# Patient Record
Sex: Male | Born: 1950 | Race: White | Hispanic: No | Marital: Married | Smoking: Current some day smoker
Health system: Southern US, Community
[De-identification: ages and names within clinical notes are randomized; demographics above are authoritative.]

## PROBLEM LIST (undated history)

## (undated) DIAGNOSIS — M109 Gout, unspecified: Secondary | ICD-10-CM

## (undated) DIAGNOSIS — I10 Essential (primary) hypertension: Secondary | ICD-10-CM

## (undated) DIAGNOSIS — C61 Malignant neoplasm of prostate: Secondary | ICD-10-CM

## (undated) DIAGNOSIS — E119 Type 2 diabetes mellitus without complications: Secondary | ICD-10-CM

## (undated) HISTORY — PX: PROSTATECTOMY: SHX69

---

## 2004-05-17 ENCOUNTER — Emergency Department: Payer: Self-pay | Admitting: Emergency Medicine

## 2006-12-01 ENCOUNTER — Ambulatory Visit: Payer: Self-pay | Admitting: Gastroenterology

## 2007-05-19 DIAGNOSIS — A6920 Lyme disease, unspecified: Secondary | ICD-10-CM | POA: Insufficient documentation

## 2007-05-19 DIAGNOSIS — I10 Essential (primary) hypertension: Secondary | ICD-10-CM | POA: Insufficient documentation

## 2007-05-19 DIAGNOSIS — E119 Type 2 diabetes mellitus without complications: Secondary | ICD-10-CM

## 2007-05-19 DIAGNOSIS — K5909 Other constipation: Secondary | ICD-10-CM

## 2007-05-19 DIAGNOSIS — M109 Gout, unspecified: Secondary | ICD-10-CM | POA: Insufficient documentation

## 2008-02-16 ENCOUNTER — Inpatient Hospital Stay: Payer: Self-pay | Admitting: Internal Medicine

## 2010-07-21 NOTE — Assessment & Plan Note (Signed)
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 Hyacinth Meeker for  evaluation of constipation and need for colonoscopy.   The patient has been in fairly good health all of his life until the  last several years.  He has been diabetic for three years and had  hypertension during that time, but also has recently been treated.  He  has had no complications from his diabetes or hypertension such as  infarctions, angina, or strokes.  When he diagnosed with diabetes, he  voluntarily lost 50 pounds of weight.   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  He does take a fiber supplement daily  and tries to drink fluids, although, this is difficult with his job.  He  denies gas, bloating, abdominal pain, melena, or hematochezia.  He  relates that he recently did hemoccult cards as part of his physical  examination which were normal.  He has not had previous barium studies  or endoscopic exams of his gut.  He denies any upper GI or hepatobiliary  complaints.  His weight is currently stable, his appetite is good.   PAST MEDICAL HISTORY:  Remarkable for diabetes and hypertension as  mentioned above.  He also has gouty arthritis and suffers from Lyme  disease.  For this he takes Septra DS and Metronidazole three days of  each month.  He has had no major surgical procedures.   MEDICATIONS:  1. Metformin 500 mg twice a day.  2. Allopurinol 300 mg a day.  3. Colchicine 0.6 mg twice a day.  4. Lisinopril 20 mg a day.  5. Dext-Amp 20 mg a day.  6. Minocycline 100 mg a day.  7. Psyllium husks daily, and a variety of multivitamins  along with a      baby aspirin 81 mg a day.  8. Ibuprofen 800 mg a day.   FAMILY HISTORY:  Remarkable for prostate cancer in his father.  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  He does not smoke except for an occasional  cigar.   REVIEW OF SYSTEMS:  Otherwise noncontributory except for mild chronic  fatigue which he associates with his Lyme disease.   LABORATORY DATA:  Recent CBC and metabolic profile and thyroid function  tests were normal.  Hemoglobin A1C on September 4, was 6.3%.   PHYSICAL EXAMINATION:  GENERAL:  He is a healthy appearing white male in  no distress appearing his stated age.  VITAL SIGNS:  He is 6 feet 2 inches tall and weighs 218 pounds.  Blood  pressure 136/84, pulse 84 and regular.  I could not appreciate stigmata  of chronic liver disease or thyromegaly.  CHEST:  Clear and he seemed to  be in a regular rhythm without murmurs,  rubs, or gallops.  ABDOMEN:  There was no hepatosplenomegaly, abdominal masses, or  tenderness.  Bowel sounds were normal.  EXTREMITIES:  Unremarkable.  NEUROLOGY:  Mental status was clear.   ASSESSMENT:  1. Chronic functional constipation probably related to a lack of      adequate fluids in his diet.  The patient does need screening      colonoscopy because of his age.  2. History of well controlled essential hypertension.  3. Adult onset diabetes mellitus.  4. History of gouty arthritis.  5. History of chronic anxiety syndrome and he apparently is on Adderal      for this affliction.  6. Family history of prostate cancer.  7. Vague history of Lyme disease.  The patient does take chronic      antibiotics as mentioned above.   RECOMMENDATIONS:  1. High fiber diet with fiber supplements and liberal p.o. fluids.  2. Trial of MiraLax 8 ounces at bedtime on a regular basis.  3. Outpatient colonoscopy examination.  4. Adjustments in medications for colonoscopy.  5. Consider  Amitiza trial.  6. Continue other medications as per Dr. Rondel Baton excellent medical      care.     [name]. Jarold Motto, MD, Caleen Essex, FAGA  Electronically Signed    DRP/MedQ  DD: 12/01/2006  DT: 12/01/2006  Job #: [id number]   cc:   Bethann Punches

## 2011-09-06 ENCOUNTER — Ambulatory Visit: Payer: Self-pay | Admitting: Unknown Physician Specialty

## 2016-04-09 ENCOUNTER — Emergency Department
Admission: EM | Admit: 2016-04-09 | Discharge: 2016-04-09 | Disposition: A | Discharge status: Home / Self Care | Payer: Medicare HMO | Attending: Emergency Medicine | Admitting: Emergency Medicine

## 2016-04-09 ENCOUNTER — Encounter: Payer: Self-pay | Admitting: Emergency Medicine

## 2016-04-09 DIAGNOSIS — N390 Urinary tract infection, site not specified: Secondary | ICD-10-CM | POA: Diagnosis not present

## 2016-04-09 DIAGNOSIS — F17290 Nicotine dependence, other tobacco product, uncomplicated: Secondary | ICD-10-CM | POA: Diagnosis not present

## 2016-04-09 DIAGNOSIS — Z8546 Personal history of malignant neoplasm of prostate: Secondary | ICD-10-CM | POA: Diagnosis not present

## 2016-04-09 DIAGNOSIS — E119 Type 2 diabetes mellitus without complications: Secondary | ICD-10-CM | POA: Diagnosis not present

## 2016-04-09 DIAGNOSIS — R339 Retention of urine, unspecified: Secondary | ICD-10-CM

## 2016-04-09 DIAGNOSIS — I10 Essential (primary) hypertension: Secondary | ICD-10-CM | POA: Insufficient documentation

## 2016-04-09 HISTORY — DX: Essential (primary) hypertension: I10

## 2016-04-09 HISTORY — DX: Malignant neoplasm of prostate: C61

## 2016-04-09 HISTORY — DX: Type 2 diabetes mellitus without complications: E11.9

## 2016-04-09 HISTORY — DX: Gout, unspecified: M10.9

## 2016-04-09 LAB — CBC WITH DIFFERENTIAL/PLATELET
BASOS ABS: 0.1 10*3/uL (ref 0–0.1)
Basophils Relative: 1 %
EOS PCT: 2 %
Eosinophils Absolute: 0.2 10*3/uL (ref 0–0.7)
HEMATOCRIT: 40.8 % (ref 40.0–52.0)
Hemoglobin: 13.6 g/dL (ref 13.0–18.0)
LYMPHS PCT: 9 %
Lymphs Abs: 1 10*3/uL (ref 1.0–3.6)
MCH: 29.4 pg (ref 26.0–34.0)
MCHC: 33.4 g/dL (ref 32.0–36.0)
MCV: 87.9 fL (ref 80.0–100.0)
Monocytes Absolute: 0.6 10*3/uL (ref 0.2–1.0)
Monocytes Relative: 6 %
NEUTROS ABS: 8.9 10*3/uL — AB (ref 1.4–6.5)
Neutrophils Relative %: 82 %
PLATELETS: 227 10*3/uL (ref 150–440)
RBC: 4.65 MIL/uL (ref 4.40–5.90)
RDW: 13.1 % (ref 11.5–14.5)
WBC: 10.8 10*3/uL — AB (ref 3.8–10.6)

## 2016-04-09 LAB — URINALYSIS, COMPLETE (UACMP) WITH MICROSCOPIC
Bilirubin Urine: NEGATIVE
Glucose, UA: NEGATIVE mg/dL
Ketones, ur: 5 mg/dL — AB
NITRITE: NEGATIVE
PROTEIN: 30 mg/dL — AB
Specific Gravity, Urine: 1.016 (ref 1.005–1.030)
pH: 6 (ref 5.0–8.0)

## 2016-04-09 LAB — BASIC METABOLIC PANEL
ANION GAP: 10 (ref 5–15)
BUN: 23 mg/dL — ABNORMAL HIGH (ref 6–20)
CO2: 23 mmol/L (ref 22–32)
Calcium: 9.5 mg/dL (ref 8.9–10.3)
Chloride: 102 mmol/L (ref 101–111)
Creatinine, Ser: 1.35 mg/dL — ABNORMAL HIGH (ref 0.61–1.24)
GFR calc Af Amer: >60 mL/min (ref >60)
GFR, EST NON AFRICAN AMERICAN: 53 mL/min — AB (ref >60)
GLUCOSE: 248 mg/dL — AB (ref 65–99)
POTASSIUM: 3.9 mmol/L (ref 3.5–5.1)
Sodium: 135 mmol/L (ref 135–145)

## 2016-04-09 MED ORDER — MORPHINE SULFATE (PF) 4 MG/ML IV SOLN
4.0000 mg | Freq: Once | INTRAVENOUS | Status: AC
  Administered 2016-04-09: 4 mg via INTRAVENOUS

## 2016-04-09 MED ORDER — DEXTROSE 5 % IV SOLN: 1.0000 g | Freq: Once | INTRAVENOUS | Status: DC

## 2016-04-09 MED ORDER — CEFTRIAXONE SODIUM-DEXTROSE 1-3.74 GM-% IV SOLR
1.0000 g | Freq: Once | INTRAVENOUS | Status: AC
  Administered 2016-04-09: 1 g via INTRAVENOUS
  Filled 2016-04-09: qty 50

## 2016-04-09 MED ORDER — CEPHALEXIN 500 MG PO CAPS: 500.0000 mg | ORAL_CAPSULE | Freq: Three times a day (TID) | ORAL | 0 refills | Status: AC

## 2016-04-09 MED ORDER — MORPHINE SULFATE (PF) 4 MG/ML IV SOLN
INTRAVENOUS | Status: AC
  Administered 2016-04-09: 4 mg via INTRAVENOUS
  Filled 2016-04-09: qty 1

## 2016-04-09 NOTE — ED Triage Notes (Signed)
Pt presents to [place] c/o urinary retention. 12/13 prostate was removed at [facility]. Past several days pt noticed small amount of blood with urination. Concerned that he has a blood clot in his urethra. Severe bladder pain. Not voided since this morning.

## 2016-04-09 NOTE — ED Notes (Signed)
Pt reports he tried to use bathroom around 4:30pm reports was not able and started to develop severe pain to penis. Attempted indwelling catheter obtained 314ml output pt reported severe pain unable to inflate balloon, Colletta Maryland ED Tech reported to this RN. Removed catheter provided medication for pain and per Dr. Archie Balboa to try indwelling catherer

## 2016-04-09 NOTE — Discharge Instructions (Signed)
Please seek medical attention for any high fevers, chest pain, shortness of breath, change in behavior, persistent vomiting, bloody stool or any other new or concerning symptoms.  

## 2016-04-09 NOTE — ED Provider Notes (Signed)
[facility] Emergency Department Provider Note   ____________________________________________   I have reviewed the triage vital signs and the nursing notes.   HISTORY  Chief Complaint Urinary Retention   History limited by: Not Limited   HPI Erik Chen. is a 66 y.o. male who presents to the emergency department today because of concerns for urinary retention. The patient states that this happened today. He noticed a little bit of blood in his urine and then was unable to pass any urine. He started developing severe pain. There was some burning with this. Patient recently had a prostatectomy late last year. He did have a urinary tract infection after that procedure and had to have the catheter replaced. Patient denies any fevers.   Past Medical History:  Diagnosis Date  . Diabetes mellitus without complication ([place])   . Gout   . Hypertension   . Prostate cancer [facility])     Patient Active Problem List   Diagnosis Date Noted  . LYME DISEASE 05/19/2007  . DIABETES MELLITUS 05/19/2007  . GOUTY ARTHRITIS 05/19/2007  . HYPERTENSION 05/19/2007  . CONSTIPATION, CHRONIC 05/19/2007    Past Surgical History:  Procedure Laterality Date  . PROSTATECTOMY      Prior to Admission medications   Not on File    Allergies Patient has no known allergies.  No family history on file.  Social History Social History  Substance Use Topics  . Smoking status: Current Some Day Smoker    Types: Cigars  . Smokeless tobacco: Never Used  . Alcohol use No    Review of Systems  Constitutional: Negative for fever. Cardiovascular: Negative for chest pain. Respiratory: Negative for shortness of breath. Gastrointestinal: Negative for abdominal pain, vomiting and diarrhea. Genitourinary: Positive for urinary retention. Neurological: Negative for headaches, focal weakness or numbness.  10-point ROS otherwise  negative.  ____________________________________________   PHYSICAL EXAM:  VITAL SIGNS: ED Triage Vitals  Enc Vitals Group     BP 04/09/16 1910 (!) 139/106     Pulse Rate 04/09/16 1910 (!) 113     Resp 04/09/16 1910 (!) 22     Temp 04/09/16 1910 98 F (36.7 C)     Temp Source 04/09/16 1910 Oral     SpO2 04/09/16 1910 98 %     Weight 04/09/16 1911 220 lb (99.8 kg)     Height 04/09/16 1911 6\' 1"  (1.854 m)     Head Circumference --      Peak Flow --      Pain Score 04/09/16 1911 9   Constitutional: Alert and oriented. Well appearing and in no distress. Eyes: Conjunctivae are normal. Normal extraocular movements. ENT   Head: Normocephalic and atraumatic.   Nose: No congestion/rhinnorhea.   Mouth/Throat: Mucous membranes are moist.   Neck: No stridor. Hematological/Lymphatic/Immunilogical: No cervical lymphadenopathy. Cardiovascular: Normal rate, regular rhythm.  No murmurs, rubs, or gallops.  Respiratory: Normal respiratory effort without tachypnea nor retractions. Breath sounds are clear and equal bilaterally. No wheezes/rales/rhonchi. Gastrointestinal: Soft and non tender. No rebound. No guarding.  Genitourinary: Deferred Musculoskeletal: Normal range of motion in all extremities. No lower extremity edema. Neurologic:  Normal speech and language. No gross focal neurologic deficits are appreciated.  Skin:  Skin is warm, dry and intact. No rash noted. Psychiatric: Mood and affect are normal. Speech and behavior are normal. Patient exhibits appropriate insight and judgment.  ____________________________________________    LABS (pertinent positives/negatives)  Labs Reviewed  CBC WITH DIFFERENTIAL/PLATELET - Abnormal; Notable for  the following:       Result Value   WBC 10.8 (*)    Neutro Abs 8.9 (*)    All other components within normal limits  BASIC METABOLIC PANEL - Abnormal; Notable for the following:    Glucose, Bld 248 (*)    BUN 23 (*)    Creatinine,  Ser 1.35 (*)    GFR calc non Af Amer 53 (*)    All other components within normal limits  URINALYSIS, COMPLETE (UACMP) WITH MICROSCOPIC - Abnormal; Notable for the following:    Color, Urine YELLOW (*)    APPearance CLEAR (*)    Hgb urine dipstick LARGE (*)    Ketones, ur 5 (*)    Protein, ur 30 (*)    Leukocytes, UA TRACE (*)    Bacteria, UA RARE (*)    Squamous Epithelial / LPF 0-5 (*)    All other components within normal limits     ____________________________________________   EKG  None  ____________________________________________    RADIOLOGY  None  ____________________________________________   PROCEDURES  Procedures  ____________________________________________   INITIAL IMPRESSION / ASSESSMENT AND PLAN / ED COURSE  Pertinent labs & imaging results that were available during my care of the patient were reviewed by me and considered in my medical decision making (see chart for details).  Patient presented to the emergency department today because of concerns for urinary retention and pain. Patient stated that he did feel much improved after Foley catheter was placed. Urine was concerning for possible urinary tract infection. Given urinary retention will treat. Patient has urologist at [facility]. Will outpatient follow-up there.  ____________________________________________   FINAL CLINICAL IMPRESSION(S) / ED DIAGNOSES  Final diagnoses:  Urinary retention  Acute lower UTI     Note: This dictation was prepared with Dragon dictation. Any transcriptional errors that result from this process are unintentional     Nance Pear, MD 04/09/16 2240

## 2016-04-17 ENCOUNTER — Emergency Department
Admission: EM | Admit: 2016-04-17 | Discharge: 2016-04-17 | Disposition: A | Discharge status: Home / Self Care | Payer: Medicare HMO | Attending: Emergency Medicine | Admitting: Emergency Medicine

## 2016-04-17 DIAGNOSIS — I10 Essential (primary) hypertension: Secondary | ICD-10-CM | POA: Diagnosis not present

## 2016-04-17 DIAGNOSIS — E119 Type 2 diabetes mellitus without complications: Secondary | ICD-10-CM | POA: Diagnosis not present

## 2016-04-17 DIAGNOSIS — R339 Retention of urine, unspecified: Secondary | ICD-10-CM | POA: Insufficient documentation

## 2016-04-17 DIAGNOSIS — F17210 Nicotine dependence, cigarettes, uncomplicated: Secondary | ICD-10-CM | POA: Insufficient documentation

## 2016-04-17 DIAGNOSIS — Z8546 Personal history of malignant neoplasm of prostate: Secondary | ICD-10-CM | POA: Insufficient documentation

## 2016-04-17 MED ORDER — LIDOCAINE HCL 2 % EX GEL
CUTANEOUS | Status: AC
  Filled 2016-04-17: qty 10

## 2016-04-17 MED ORDER — LIDOCAINE HCL 2 % EX GEL
1.0000 "application " | Freq: Once | CUTANEOUS | Status: AC
  Administered 2016-04-17: 1 via URETHRAL

## 2016-04-17 NOTE — ED Provider Notes (Signed)
[facility] Emergency Department Provider Note        Time seen: ----------------------------------------- 7:58 PM on 04/17/2016 -----------------------------------------    I have reviewed the triage vital signs and the nursing notes.   HISTORY  Chief Complaint Urinary Retention    HPI Erik Chen. is a 66 y.o. male who presents to the ER for urinary retention. Patient reports being unable to urinate since lunchtime today. Patient just had a Foley catheter removed yesterday after he was present for a week due to urinary retention. Patient states he started seeing some blood in his urine yesterday. This is the third time he has needed a Foley catheter since his prostate surgery in December. He has been in discussions with his urologist to have cystoscopy since this has been recurrent.Currently complaining of suprapubic pain.   Past Medical History:  Diagnosis Date  . Diabetes mellitus without complication ([place])   . Gout   . Hypertension   . Prostate cancer [facility])     Patient Active Problem List   Diagnosis Date Noted  . LYME DISEASE 05/19/2007  . DIABETES MELLITUS 05/19/2007  . GOUTY ARTHRITIS 05/19/2007  . HYPERTENSION 05/19/2007  . CONSTIPATION, CHRONIC 05/19/2007    Past Surgical History:  Procedure Laterality Date  . PROSTATECTOMY      Allergies Patient has no known allergies.  Social History Social History  Substance Use Topics  . Smoking status: Current Some Day Smoker    Types: Cigars  . Smokeless tobacco: Never Used  . Alcohol use No    Review of Systems Constitutional: Negative for fever. Cardiovascular: Negative for chest pain. Respiratory: Negative for shortness of breath. Gastrointestinal: Positive for abdominal pain Genitourinary: Positive for urinary hesitancy, retention, hematuria Musculoskeletal: Negative for back pain. Skin: Negative for rash. Neurological: Negative for headaches, focal weakness or  numbness.  10-point ROS otherwise negative.  ____________________________________________   PHYSICAL EXAM:  VITAL SIGNS: ED Triage Vitals  Enc Vitals Group     BP 04/17/16 1952 (!) 114/93     Pulse Rate 04/17/16 1952 (!) 110     Resp 04/17/16 1952 20     Temp 04/17/16 1952 98.5 F (36.9 C)     Temp Source 04/17/16 1952 Oral     SpO2 04/17/16 1952 100 %     Weight 04/17/16 1953 220 lb (99.8 kg)     Height 04/17/16 1953 6\' 1"  (1.854 m)     Head Circumference --      Peak Flow --      Pain Score 04/17/16 1953 5     Pain Loc --      Pain Edu? --      Excl. in [place]? --     Constitutional: Alert and oriented. Well appearing and in no distress. Eyes: Conjunctivae are normal. Normal extraocular movements. Gastrointestinal: Suprapubic tenderness, no rebound or guarding. Normal bowel sounds. Musculoskeletal: Nontender with normal range of motion in all extremities. No lower extremity tenderness nor edema. Neurologic:  Normal speech and language. No gross focal neurologic deficits are appreciated.  Skin:  Skin is warm, dry and intact. No rash noted. Psychiatric: Mood and affect are normal. Speech and behavior are normal.  ____________________________________________  ED COURSE:  Pertinent labs & imaging results that were available during my care of the patient were reviewed by me and considered in my medical decision making (see chart for details). Patient is no distress, he will require Foley catheter replacement.   Procedures  ____________________________________________  FINAL ASSESSMENT  AND PLAN  Urinary retention  Plan: Patient with recurrent urinary retention. We have replaced Foley catheter, I will not check anymore lab work as he is just had this done recently. He is stable for outpatient follow-up with his urologist.   Earleen Newport, MD   Note: This note was generated in part or whole with voice recognition software. Voice recognition is usually quite  accurate but there are transcription errors that can and very often do occur. I apologize for any typographical errors that were not detected and corrected.     Earleen Newport, MD 04/17/16 Despina Pole

## 2016-04-17 NOTE — ED Triage Notes (Signed)
Pt states has been unable to urinate since lunchtime. Pt states has had urinary retention since December intermittently requring urinary cath since prostate surgery.

## 2016-04-19 LAB — URINE CULTURE
Culture: NO GROWTH
Special Requests: NORMAL

## 2017-06-06 ENCOUNTER — Encounter: Payer: Self-pay | Admitting: Emergency Medicine

## 2017-06-06 ENCOUNTER — Emergency Department: Payer: Medicare HMO

## 2017-06-06 ENCOUNTER — Other Ambulatory Visit: Payer: Self-pay

## 2017-06-06 ENCOUNTER — Emergency Department
Admission: EM | Admit: 2017-06-06 | Discharge: 2017-06-06 | Disposition: A | Discharge status: Home / Self Care | Payer: Medicare HMO | Attending: Emergency Medicine | Admitting: Emergency Medicine

## 2017-06-06 DIAGNOSIS — W208XXA Other cause of strike by thrown, projected or falling object, initial encounter: Secondary | ICD-10-CM | POA: Diagnosis not present

## 2017-06-06 DIAGNOSIS — S0181XA Laceration without foreign body of other part of head, initial encounter: Secondary | ICD-10-CM | POA: Diagnosis not present

## 2017-06-06 DIAGNOSIS — Y9389 Activity, other specified: Secondary | ICD-10-CM | POA: Diagnosis not present

## 2017-06-06 DIAGNOSIS — Y92015 Private garage of single-family (private) house as the place of occurrence of the external cause: Secondary | ICD-10-CM | POA: Insufficient documentation

## 2017-06-06 DIAGNOSIS — S0285XA Fracture of orbit, unspecified, initial encounter for closed fracture: Secondary | ICD-10-CM

## 2017-06-06 DIAGNOSIS — F17290 Nicotine dependence, other tobacco product, uncomplicated: Secondary | ICD-10-CM | POA: Diagnosis not present

## 2017-06-06 DIAGNOSIS — S0990XA Unspecified injury of head, initial encounter: Secondary | ICD-10-CM | POA: Diagnosis present

## 2017-06-06 DIAGNOSIS — I10 Essential (primary) hypertension: Secondary | ICD-10-CM | POA: Diagnosis not present

## 2017-06-06 DIAGNOSIS — Y999 Unspecified external cause status: Secondary | ICD-10-CM | POA: Insufficient documentation

## 2017-06-06 DIAGNOSIS — E119 Type 2 diabetes mellitus without complications: Secondary | ICD-10-CM | POA: Diagnosis not present

## 2017-06-06 DIAGNOSIS — S0232XA Fracture of orbital floor, left side, initial encounter for closed fracture: Secondary | ICD-10-CM | POA: Insufficient documentation

## 2017-06-06 MED ORDER — LIDOCAINE-EPINEPHRINE-TETRACAINE (LET) SOLUTION
3.0000 mL | Freq: Once | NASAL | Status: AC
  Administered 2017-06-06: 20:00:00 3 mL via TOPICAL
  Filled 2017-06-06: qty 3

## 2017-06-06 MED ORDER — AMOXICILLIN-POT CLAVULANATE 875-125 MG PO TABS
1.0000 | ORAL_TABLET | Freq: Once | ORAL | Status: AC
  Administered 2017-06-06: 1 via ORAL
  Filled 2017-06-06: qty 1

## 2017-06-06 MED ORDER — HYDROCODONE-ACETAMINOPHEN 5-325 MG PO TABS
1.0000 | ORAL_TABLET | ORAL | Status: AC
  Administered 2017-06-06: 1 via ORAL

## 2017-06-06 MED ORDER — HYDROCODONE-ACETAMINOPHEN 5-325 MG PO TABS
ORAL_TABLET | ORAL | Status: AC
  Administered 2017-06-06: 1 via ORAL
  Filled 2017-06-06: qty 1

## 2017-06-06 MED ORDER — LIDOCAINE-EPINEPHRINE-TETRACAINE (LET) SOLUTION
3.0000 mL | Freq: Once | NASAL | Status: AC
  Administered 2017-06-06: 3 mL via TOPICAL
  Filled 2017-06-06: qty 3

## 2017-06-06 MED ORDER — OXYCODONE HCL 5 MG PO TABS: 5.0000 mg | ORAL_TABLET | ORAL | 0 refills | Status: AC | PRN

## 2017-06-06 MED ORDER — AMOXICILLIN-POT CLAVULANATE 875-125 MG PO TABS: 1.0000 | ORAL_TABLET | Freq: Two times a day (BID) | ORAL | 0 refills | Status: AC

## 2017-06-06 MED ORDER — LIDOCAINE-EPINEPHRINE-TETRACAINE (LET) SOLUTION
3.0000 mL | Freq: Once | NASAL | Status: AC
  Administered 2017-06-06: 3 mL via TOPICAL

## 2017-06-06 MED ORDER — HYDROCODONE-ACETAMINOPHEN 5-325 MG PO TABS
1.0000 | ORAL_TABLET | ORAL | Status: AC
  Administered 2017-06-06: 1 via ORAL
  Filled 2017-06-06: qty 1

## 2017-06-06 MED ORDER — LIDOCAINE-EPINEPHRINE-TETRACAINE (LET) SOLUTION
NASAL | Status: AC
  Administered 2017-06-06: 3 mL via TOPICAL
  Filled 2017-06-06: qty 3

## 2017-06-06 NOTE — ED Provider Notes (Signed)
[facility] EMERGENCY DEPARTMENT Provider Note   CSN: [id number] Arrival date & time: 06/06/17  1626     History   Chief Complaint Chief Complaint  Patient presents with  . Facial Laceration    HPI Erik Chen. is a 67 y.o. male.  Presents to the emergency department for evaluation of face laceration to the left maxillary region extending up the left side of the nose into the scalp.  Patient states earlier today he was working in his garage grinding a piece of metal when the piece of metal kicked back and hit him in the left side of the nose.  Patient states he was dazed for a second, denies losing consciousness.  No nausea or vomiting.  He has facial pain with no vision changes or pain with extraocular eye movement.  His tetanus is up-to-date.  Patient's pain is moderate.  HPI  Past Medical History:  Diagnosis Date  . Diabetes mellitus without complication ([place])   . Gout   . Hypertension   . Prostate cancer [facility])     Patient Active Problem List   Diagnosis Date Noted  . LYME DISEASE 05/19/2007  . DIABETES MELLITUS 05/19/2007  . GOUTY ARTHRITIS 05/19/2007  . HYPERTENSION 05/19/2007  . CONSTIPATION, CHRONIC 05/19/2007    Past Surgical History:  Procedure Laterality Date  . PROSTATECTOMY          Home Medications    Prior to Admission medications   Medication Sig Start Date End Date Taking? Authorizing Provider  amoxicillin-clavulanate (AUGMENTIN) 875-125 MG tablet Take 1 tablet by mouth every 12 (twelve) hours for 10 days. 06/06/17 06/16/17  Duanne Guess, PA-C  cephALEXin (KEFLEX) 500 MG capsule Take 1 capsule (500 mg total) by mouth 3 (three) times daily. 04/09/16   Nance Pear, MD  oxyCODONE (ROXICODONE) 5 MG immediate release tablet Take 1 tablet (5 mg total) by mouth every 4 (four) hours as needed. 06/06/17 06/06/18  Duanne Guess, PA-C    Family History No family history on file.  Social History Social History   Tobacco  Use  . Smoking status: Current Some Day Smoker    Types: Cigars  . Smokeless tobacco: Never Used  Substance Use Topics  . Alcohol use: No  . Drug use: Not on file     Allergies   Patient has no known allergies.   Review of Systems Review of Systems  Eyes: Negative for pain, discharge, redness and visual disturbance.  Respiratory: Negative for shortness of breath.   Cardiovascular: Negative for chest pain.  Musculoskeletal: Negative for arthralgias, back pain, gait problem and neck pain.  Skin: Positive for wound.  Neurological: Positive for headaches.     Physical Exam Updated Vital Signs BP 130/78 (BP Location: Left Arm)   Pulse 68   Temp 97.7 F (36.5 C) (Oral)   Resp 16   Wt 99.8 kg (220 lb)   SpO2 96%   BMI 29.03 kg/m   Physical Exam  Constitutional: He is oriented to person, place, and time. He appears well-developed and well-nourished.  HENT:  Head: Normocephalic.  Right Ear: External ear normal.  Left Ear: External ear normal.  Mouth/Throat: Oropharynx is clear and moist. No oropharyngeal exudate.  Linear laceration extending along the inferior orbital rim of to the bridge of the nose just above the bridge of the nose.  Laceration is linear.  Small laceration noted at the left aspect of the nose towards the nostril opening.  No signs of  septal hematoma or nasal bleeding.  No sign of oral injury.  Small superficial lacerations noted along the upper eyelid x2.  Eyes: Pupils are equal, round, and reactive to light. EOM are normal.  No pain with extraocular movement.  No limited range of motion.  Neck: Normal range of motion.  Cardiovascular: Normal rate.  Pulmonary/Chest: Effort normal. No respiratory distress. He has no wheezes. He has no rales.  Musculoskeletal: Normal range of motion.  Neurological: He is alert and oriented to person, place, and time. No cranial nerve deficit. Coordination normal.  Skin: Skin is warm. No rash noted.  Psychiatric: He has a  normal mood and affect. His behavior is normal. Judgment and thought content normal.     ED Treatments / Results  Labs (all labs ordered are listed, but only abnormal results are displayed) Labs Reviewed - No data to display  EKG None  Radiology Ct Head Wo Contrast  Result Date: 06/06/2017 CLINICAL DATA:  Laceration, struck by would. EXAM: CT HEAD WITHOUT CONTRAST CT MAXILLOFACIAL WITHOUT CONTRAST TECHNIQUE: Multidetector CT imaging of the head and maxillofacial structures were performed using the standard protocol without intravenous contrast. Multiplanar CT image reconstructions of the maxillofacial structures were also generated. COMPARISON:  None. FINDINGS: CT HEAD FINDINGS Brain: The brain shows a normal appearance without evidence of malformation, atrophy, old or acute small or large vessel infarction, mass lesion, hemorrhage, hydrocephalus or extra-axial collection. Vascular: No hyperdense vessel. No evidence of atherosclerotic calcification. Skull: Normal.  No traumatic finding.  No focal calvarial lesion. Sinuses/Orbits: See results of facial CT. Other: None significant CT MAXILLOFACIAL FINDINGS Osseous: There is a fracture involving the left inferior orbital rim and anterior wall of the maxillary sinus with a fragment measuring about 9 mm, depressed 3.5 mm. No other facial fracture is seen. 1 could question an incipient tripod fracture on the left with slight prominence of the zygomatic frontal suture and questionable minimal bowing of the lateral wall of the orbit and zygomatic arch, but there are not clear fractures in those locations. Small amount of fluid layering in the left maxillary sinus. No nasal fracture. Orbits: No evidence of injury to the globe. Anterior periorbital soft tissue swelling. Soft tissue swelling of the forehead and left side of the nose. Sinuses: As noted above, there is some blood in the left maxillary sinus. Other sinuses are clear. Middle ears and mastoids are  negative. Soft tissues: Soft tissue swelling of the left face as noted above. IMPRESSION: Focal fracture involving the left inferior orbital rim and anterior wall the maxillary sinus. Approximately 9 mm fragment depressed about 3.5 mm. Question incipient tripod fracture on the left, without definite fracture lines or displacement. See above discussion. No intracranial injury. Electronically Signed   By: Nelson Chimes M.D.   On: 06/06/2017 18:31   Ct Maxillofacial Wo Contrast  Result Date: 06/06/2017 CLINICAL DATA:  Laceration, struck by would. EXAM: CT HEAD WITHOUT CONTRAST CT MAXILLOFACIAL WITHOUT CONTRAST TECHNIQUE: Multidetector CT imaging of the head and maxillofacial structures were performed using the standard protocol without intravenous contrast. Multiplanar CT image reconstructions of the maxillofacial structures were also generated. COMPARISON:  None. FINDINGS: CT HEAD FINDINGS Brain: The brain shows a normal appearance without evidence of malformation, atrophy, old or acute small or large vessel infarction, mass lesion, hemorrhage, hydrocephalus or extra-axial collection. Vascular: No hyperdense vessel. No evidence of atherosclerotic calcification. Skull: Normal.  No traumatic finding.  No focal calvarial lesion. Sinuses/Orbits: See results of facial CT. Other: None significant CT  MAXILLOFACIAL FINDINGS Osseous: There is a fracture involving the left inferior orbital rim and anterior wall of the maxillary sinus with a fragment measuring about 9 mm, depressed 3.5 mm. No other facial fracture is seen. 1 could question an incipient tripod fracture on the left with slight prominence of the zygomatic frontal suture and questionable minimal bowing of the lateral wall of the orbit and zygomatic arch, but there are not clear fractures in those locations. Small amount of fluid layering in the left maxillary sinus. No nasal fracture. Orbits: No evidence of injury to the globe. Anterior periorbital soft tissue  swelling. Soft tissue swelling of the forehead and left side of the nose. Sinuses: As noted above, there is some blood in the left maxillary sinus. Other sinuses are clear. Middle ears and mastoids are negative. Soft tissues: Soft tissue swelling of the left face as noted above. IMPRESSION: Focal fracture involving the left inferior orbital rim and anterior wall the maxillary sinus. Approximately 9 mm fragment depressed about 3.5 mm. Question incipient tripod fracture on the left, without definite fracture lines or displacement. See above discussion. No intracranial injury. Electronically Signed   By: Nelson Chimes M.D.   On: 06/06/2017 18:31    Procedures .Marland KitchenLaceration Repair Date/Time: 06/06/2017 8:42 PM Performed by: Duanne Guess, PA-C Authorized by: Duanne Guess, PA-C   Consent:    Consent obtained:  Verbal   Consent given by:  Patient   Risks discussed:  Infection, need for additional repair and poor wound healing   Alternatives discussed:  No treatment Anesthesia (see MAR for exact dosages):    Anesthesia method:  Topical application   Topical anesthetic:  LET Laceration details:    Location:  Face   Face location:  Nose (Upper eyelid)   Length (cm):  6   Depth (mm):  3 Repair type:    Repair type:  Simple Pre-procedure details:    Preparation:  Patient was prepped and draped in usual sterile fashion and imaging obtained to evaluate for foreign bodies Exploration:    Hemostasis achieved with:  LET   Wound exploration: wound explored through full range of motion and entire depth of wound probed and visualized     Contaminated: no   Treatment:    Area cleansed with:  Betadine and saline   Amount of cleaning:  Standard   Visualized foreign bodies/material removed: no   Skin repair:    Repair method:  Sutures   Suture size:  6-0   Suture technique:  Simple interrupted   Number of sutures:  16 Approximation:    Approximation:  Close Post-procedure details:    Dressing:   Open (no dressing)   (including critical care time)  Medications Ordered in ED Medications  amoxicillin-clavulanate (AUGMENTIN) 875-125 MG per tablet 1 tablet (has no administration in time range)  HYDROcodone-acetaminophen (NORCO/VICODIN) 5-325 MG per tablet 1 tablet (1 tablet Oral Given 06/06/17 1752)  lidocaine-EPINEPHrine-tetracaine (LET) solution (3 mLs Topical Given 06/06/17 1755)  lidocaine-EPINEPHrine-tetracaine (LET) solution (3 mLs Topical Given 06/06/17 1950)  lidocaine-EPINEPHrine-tetracaine (LET) solution (3 mLs Topical Given 06/06/17 1950)  HYDROcodone-acetaminophen (NORCO/VICODIN) 5-325 MG per tablet 1 tablet (1 tablet Oral Given 06/06/17 1951)     Initial Impression / Assessment and Plan / ED Course  I have reviewed the triage vital signs and the nursing notes.  Pertinent labs & imaging results that were available during my care of the patient were reviewed by me and considered in my medical decision making (see chart for details).  67 year old male with blunt force trauma to the face.  Suffered laceration along with a underlying orbital rim fracture extending into the left maxillary bone.  Patient doing well.  Pain controlled.  No sign of any muscle entrapment or vision changes in the left eye.  Laceration is repaired.  Patient started on antibiotics.  He will follow-up with ENT physician by calling the office tomorrow morning to schedule follow-up appointment.  Patient understands signs and symptoms return to ED for.  Final Clinical Impressions(s) / ED Diagnoses   Final diagnoses:  Facial laceration, initial encounter  Orbital fracture, closed, initial encounter [facility])    ED Discharge Orders        Ordered    oxyCODONE (ROXICODONE) 5 MG immediate release tablet  Every 4 hours PRN     06/06/17 2034    amoxicillin-clavulanate (AUGMENTIN) 875-125 MG tablet  Every 12 hours     06/06/17 2034       Renata Caprice 06/06/17 2045    Nance Pear, MD 06/06/17  2047

## 2017-06-06 NOTE — ED Triage Notes (Signed)
Hit in head with wood while sanding with electric sander.  Laceration to face.  Bleeding controlled.

## 2017-06-06 NOTE — ED Notes (Signed)
See triage note  Presents with laceration to head/face from a piece of wood  States he was using a sander and a piece of wood came back

## 2017-06-06 NOTE — Discharge Instructions (Addendum)
Please take and complete antibiotics as prescribed.  You may use Tylenol as needed for mild pain.  Take oxycodone 5 mg every 4 hours as needed for moderate to severe pain.  Call ENT physician tomorrow morning to schedule follow-up appointment.  Your sutures will need to be removed in 5-6 days.  If any increasing eye pain or vision changes, headaches nausea or vomiting return to the emergency department.

## 2018-12-01 ENCOUNTER — Other Ambulatory Visit: Payer: Self-pay

## 2018-12-01 ENCOUNTER — Other Ambulatory Visit (HOSPITAL_COMMUNITY): Payer: Self-pay | Admitting: Internal Medicine

## 2018-12-01 ENCOUNTER — Other Ambulatory Visit: Payer: Self-pay | Admitting: Internal Medicine

## 2018-12-01 ENCOUNTER — Ambulatory Visit
Admission: RE | Admit: 2018-12-01 | Discharge: 2018-12-01 | Disposition: A | Discharge status: Home / Self Care | Payer: Medicare HMO | Source: Ambulatory Visit | Attending: Internal Medicine | Admitting: Internal Medicine

## 2018-12-01 DIAGNOSIS — R1031 Right lower quadrant pain: Secondary | ICD-10-CM | POA: Insufficient documentation

## 2018-12-01 DIAGNOSIS — R1032 Left lower quadrant pain: Secondary | ICD-10-CM | POA: Diagnosis present

## 2018-12-01 DIAGNOSIS — C61 Malignant neoplasm of prostate: Secondary | ICD-10-CM | POA: Diagnosis present

## 2018-12-01 LAB — POCT I-STAT CREATININE: Creatinine, Ser: 1.7 mg/dL — ABNORMAL HIGH (ref 0.61–1.24)

## 2018-12-01 MED ORDER — IOHEXOL 300 MG/ML  SOLN
75.0000 mL | Freq: Once | INTRAMUSCULAR | Status: AC | PRN
  Administered 2018-12-01: 15:00:00 75 mL via INTRAVENOUS

## 2019-09-19 ENCOUNTER — Ambulatory Visit: Payer: Medicare HMO | Admitting: Radiation Oncology

## 2019-12-30 ENCOUNTER — Emergency Department
Admission: EM | Admit: 2019-12-30 | Discharge: 2019-12-30 | Disposition: A | Discharge status: Home / Self Care | Payer: Medicare HMO | Attending: Emergency Medicine | Admitting: Emergency Medicine

## 2019-12-30 ENCOUNTER — Emergency Department: Payer: Medicare HMO

## 2019-12-30 ENCOUNTER — Encounter: Payer: Self-pay | Admitting: Emergency Medicine

## 2019-12-30 ENCOUNTER — Other Ambulatory Visit: Payer: Self-pay

## 2019-12-30 DIAGNOSIS — Z79899 Other long term (current) drug therapy: Secondary | ICD-10-CM | POA: Diagnosis not present

## 2019-12-30 DIAGNOSIS — W010XXA Fall on same level from slipping, tripping and stumbling without subsequent striking against object, initial encounter: Secondary | ICD-10-CM | POA: Diagnosis not present

## 2019-12-30 DIAGNOSIS — S6992XA Unspecified injury of left wrist, hand and finger(s), initial encounter: Secondary | ICD-10-CM | POA: Diagnosis present

## 2019-12-30 DIAGNOSIS — I10 Essential (primary) hypertension: Secondary | ICD-10-CM | POA: Insufficient documentation

## 2019-12-30 DIAGNOSIS — E119 Type 2 diabetes mellitus without complications: Secondary | ICD-10-CM | POA: Insufficient documentation

## 2019-12-30 DIAGNOSIS — F17290 Nicotine dependence, other tobacco product, uncomplicated: Secondary | ICD-10-CM | POA: Insufficient documentation

## 2019-12-30 DIAGNOSIS — S52615A Nondisplaced fracture of left ulna styloid process, initial encounter for closed fracture: Secondary | ICD-10-CM | POA: Diagnosis not present

## 2019-12-30 DIAGNOSIS — Y92512 Supermarket, store or market as the place of occurrence of the external cause: Secondary | ICD-10-CM | POA: Insufficient documentation

## 2019-12-30 DIAGNOSIS — S52592A Other fractures of lower end of left radius, initial encounter for closed fracture: Secondary | ICD-10-CM | POA: Insufficient documentation

## 2019-12-30 MED ORDER — OXYCODONE-ACETAMINOPHEN 5-325 MG PO TABS: 1.0000 | ORAL_TABLET | Freq: Four times a day (QID) | ORAL | 0 refills | Status: AC | PRN

## 2019-12-30 MED ORDER — OXYCODONE-ACETAMINOPHEN 5-325 MG PO TABS
1.0000 | ORAL_TABLET | Freq: Once | ORAL | Status: AC
  Administered 2019-12-30: 1 via ORAL
  Filled 2019-12-30: qty 1

## 2019-12-30 MED ORDER — OXYCODONE-ACETAMINOPHEN 5-325 MG PO TABS: 1.0000 | ORAL_TABLET | Freq: Four times a day (QID) | ORAL | 0 refills | Status: DC | PRN

## 2019-12-30 NOTE — ED Triage Notes (Signed)
Pt reports tripped and fell hurting his left wrist. Deformity noted.

## 2019-12-30 NOTE — ED Provider Notes (Signed)
[facility] Emergency Department Provider Note  ____________________________________________  Time seen: Approximately 2:42 PM  I have reviewed the triage vital signs and the nursing notes.   HISTORY  Chief Complaint Arm Injury and Wrist Pain    HPI Erik Chen. is a 69 y.o. male who presents the emergency department complaining of left wrist pain after a fall.  Patient states that he was walking across his shop, there was a thin piece of metal on the floor that caught his shoe.  Patient tried to step over the metal, however it bent catching his foot causing him to fall onto an outstretched hand.  Patient is complaining of pain and swelling to the wrist.  He did not hit his head or lose consciousness.  Patient has no other injuries or complaints at this time.  Medical history includes diabetes, gout, hypertension, prostate cancer.  No complaints of chronic medical issues at this time.         Past Medical History:  Diagnosis Date  . Diabetes mellitus without complication ([place])   . Gout   . Hypertension   . Prostate cancer [facility])     Patient Active Problem List   Diagnosis Date Noted  . LYME DISEASE 05/19/2007  . DIABETES MELLITUS 05/19/2007  . GOUTY ARTHRITIS 05/19/2007  . HYPERTENSION 05/19/2007  . CONSTIPATION, CHRONIC 05/19/2007    Past Surgical History:  Procedure Laterality Date  . PROSTATECTOMY      Prior to Admission medications   Medication Sig Start Date End Date Taking? Authorizing Provider  cephALEXin (KEFLEX) 500 MG capsule Take 1 capsule (500 mg total) by mouth 3 (three) times daily. 04/09/16   Nance Pear, MD  oxyCODONE-acetaminophen (PERCOCET/ROXICET) 5-325 MG tablet Take 1 tablet by mouth every 6 (six) hours as needed for severe pain. 12/30/19   Jayde Mcallister, Charline Bills, PA-C    Allergies Patient has no known allergies.  No family history on file.  Social History Social History   Tobacco Use  . Smoking status:  Current Some Day Smoker    Types: Cigars  . Smokeless tobacco: Never Used  Substance Use Topics  . Alcohol use: No  . Drug use: Not on file     Review of Systems  Constitutional: No fever/chills Eyes: No visual changes. No discharge ENT: No upper respiratory complaints. Cardiovascular: no chest pain. Respiratory: no cough. No SOB. Gastrointestinal: No abdominal pain.  No nausea, no vomiting.  No diarrhea.  No constipation. Musculoskeletal: Positive for left wrist injury/pain Skin: Negative for rash, abrasions, lacerations, ecchymosis. Neurological: Negative for headaches, focal weakness or numbness.  10 System ROS otherwise negative.  ____________________________________________   PHYSICAL EXAM:  VITAL SIGNS: ED Triage Vitals  Enc Vitals Group     BP 12/30/19 1344 132/73     Pulse Rate 12/30/19 1344 80     Resp 12/30/19 1344 18     Temp 12/30/19 1344 98 F (36.7 C)     Temp Source 12/30/19 1344 Oral     SpO2 12/30/19 1344 96 %     Weight 12/30/19 1335 240 lb (108.9 kg)     Height 12/30/19 1335 6\' 1"  (1.854 m)     Head Circumference --      Peak Flow --      Pain Score 12/30/19 1335 5     Pain Loc --      Pain Edu? --      Excl. in [place]? --      Constitutional: Alert and  oriented. Well appearing and in no acute distress. Eyes: Conjunctivae are normal. PERRL. EOMI. Head: Atraumatic. ENT:      Ears:       Nose: No congestion/rhinnorhea.      Mouth/Throat: Mucous membranes are moist.  Neck: No stridor.    Cardiovascular: Normal rate, regular rhythm. Normal S1 and S2.  Good peripheral circulation. Respiratory: Normal respiratory effort without tachypnea or retractions. Lungs CTAB. Good air entry to the bases with no decreased or absent breath sounds. Musculoskeletal: Full range of motion to all extremities. No gross deformities appreciated.  Visualization of the left wrist reveals mild edema but no gross deformity.  Limited range of motion due to pain.  Patient is  very tender to palpation diffusely over the distal radius and ulna.  No palpable abnormality.  Radial pulse intact.  Sensation intact all digits. Neurologic:  Normal speech and language. No gross focal neurologic deficits are appreciated.  Skin:  Skin is warm, dry and intact. No rash noted. Psychiatric: Mood and affect are normal. Speech and behavior are normal. Patient exhibits appropriate insight and judgement.   ____________________________________________   LABS (all labs ordered are listed, but only abnormal results are displayed)  Labs Reviewed - No data to display ____________________________________________  EKG   ____________________________________________  RADIOLOGY I personally viewed and evaluated these images as part of my medical decision making, as well as reviewing the written report by the radiologist.  ED Provider Interpretation: I concur with radiologist finding of nondisplaced distal radial fracture and ulnar styloid fracture  DG Wrist Complete Left  Result Date: 12/30/2019 CLINICAL DATA:  Acute LEFT wrist pain following fall today. EXAM: LEFT WRIST - COMPLETE 3+ VIEW COMPARISON:  None. FINDINGS: A nondisplaced transverse fracture of the distal radius is noted with equivocal intra-articular extension. An ulnar styloid fracture is present. No dislocation. Degenerative changes along the LATERAL wrist noted. IMPRESSION: Nondisplaced distal radial fracture with equivocal intra-articular extension. Ulnar styloid fracture. Electronically Signed   By: Margarette Canada M.D.   On: 12/30/2019 14:37    ____________________________________________    PROCEDURES  Procedure(s) performed:    .Splint Application  Date/Time: 12/30/2019 4:12 PM Performed by: Darletta Moll, PA-C Authorized by: Darletta Moll, PA-C   Consent:    Consent obtained:  Verbal   Consent given by:  Patient   Risks discussed:  Pain and swelling Pre-procedure details:    Sensation:   Normal Procedure details:    Laterality:  Left   Location:  Wrist   Wrist:  L wrist   Splint type:  Volar short arm   Supplies:  Cotton padding, Ortho-Glass and elastic bandage Post-procedure details:    Pain:  Improved   Sensation:  Normal   Patient tolerance of procedure:  Tolerated well, no immediate complications      Medications  oxyCODONE-acetaminophen (PERCOCET/ROXICET) 5-325 MG per tablet 1 tablet (1 tablet Oral Given 12/30/19 1603)     ____________________________________________   INITIAL IMPRESSION / ASSESSMENT AND PLAN / ED COURSE  Pertinent labs & imaging results that were available during my care of the patient were reviewed by me and considered in my medical decision making (see chart for details).  Review of the [place] CSRS was performed in accordance of the [place] prior to dispensing any controlled drugs.           Patient's diagnosis is consistent with fall, distal radius and ulnar styloid fracture.  Patient presented to emergency department after mechanical fall.  He tried to catch himself  with an outstretched hand.  Patient with pain, edema over the distal wrist.  He sustained no other injuries or complaints.  Remainder of exam is reassuring.  Imaging reveals nondisplaced fracture of the distal radius and ulnar styloid fracture.  Patient will have his wrist splinted at this time and referred to orthopedics for further management.  Patient will have prescription for pain medication.  patient is given ED precautions to return to the ED for any worsening or new symptoms.     ____________________________________________  FINAL CLINICAL IMPRESSION(S) / ED DIAGNOSES  Final diagnoses:  Other closed fracture of distal end of left radius, initial encounter  Closed nondisplaced fracture of styloid process of left ulna, initial encounter      NEW MEDICATIONS STARTED DURING THIS VISIT:  ED Discharge Orders         Ordered    oxyCODONE-acetaminophen  (PERCOCET/ROXICET) 5-325 MG tablet  Every 6 hours PRN,   Status:  Discontinued        12/30/19 1506    oxyCODONE-acetaminophen (PERCOCET/ROXICET) 5-325 MG tablet  Every 6 hours PRN        12/30/19 1512              This chart was dictated using voice recognition software/Dragon. Despite best efforts to proofread, errors can occur which can change the meaning. Any change was purely unintentional.    Darletta Moll, PA-C 12/30/19 1612    Lavonia Drafts, MD 01/03/20 [phone]

## 2021-05-13 IMAGING — DX DG WRIST COMPLETE 3+V*L*
4 series · 4 of 4 positions shown · non-contrast
Comparison: None.

CLINICAL DATA: Acute LEFT wrist pain following fall today.

EXAM:
LEFT WRIST - COMPLETE 3+ VIEW

[wrist ap (1 of 2)]
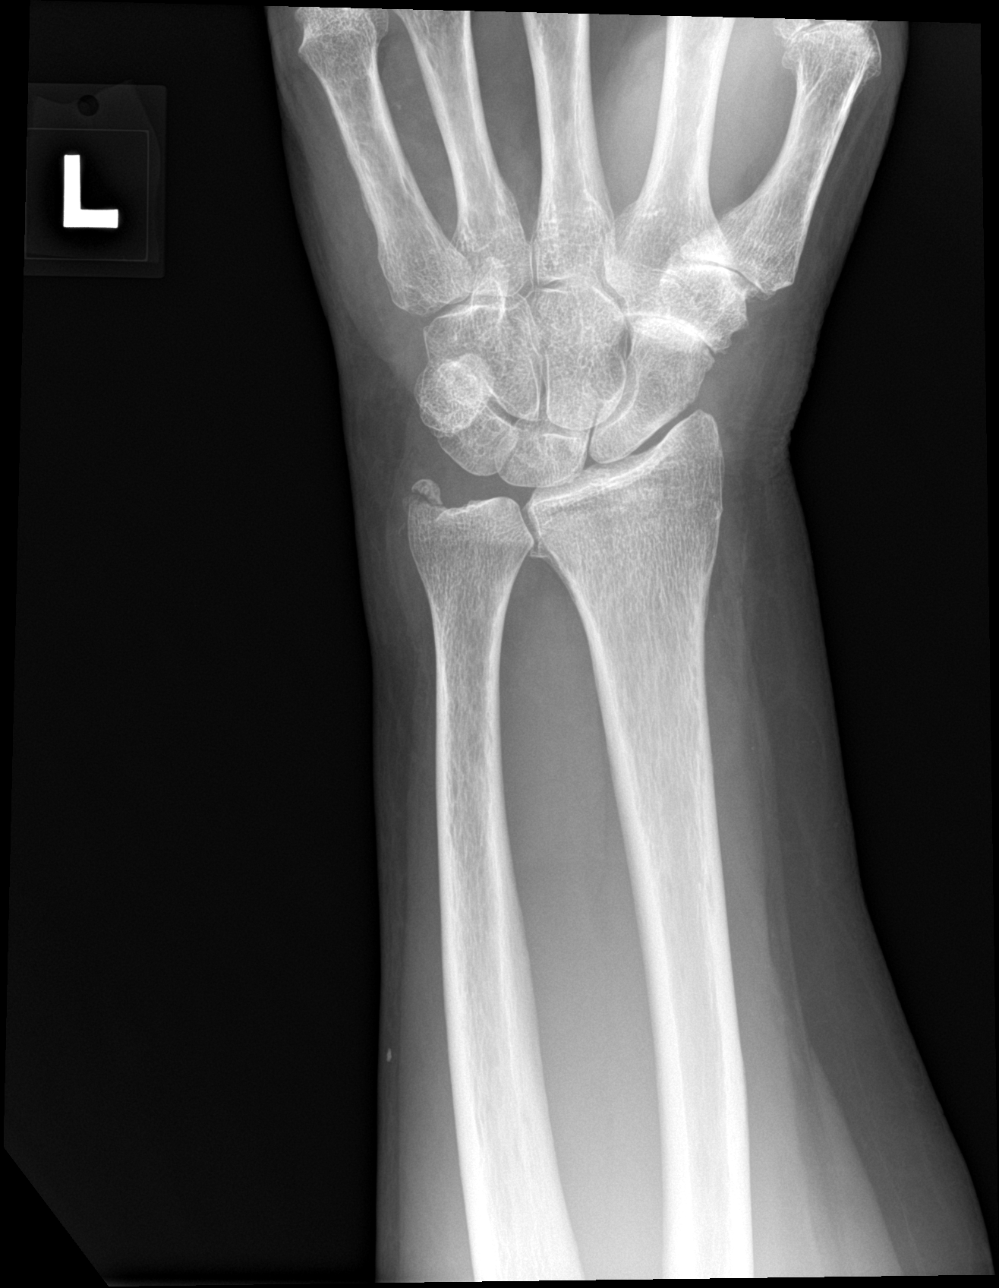

[wrist obl]
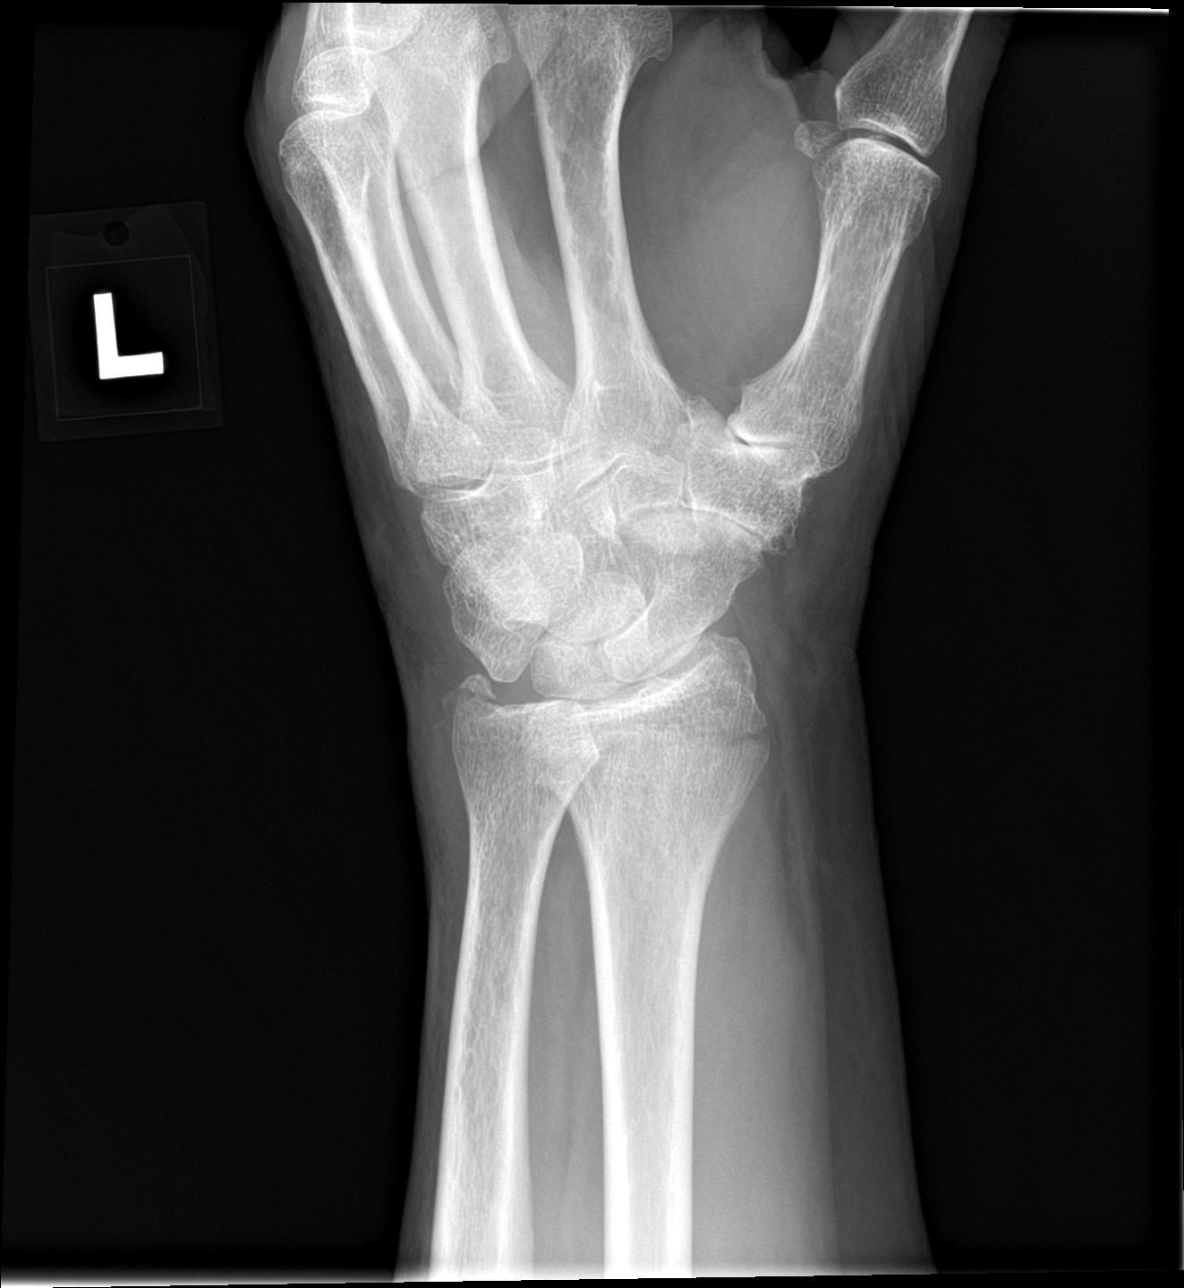

[wrist lat]
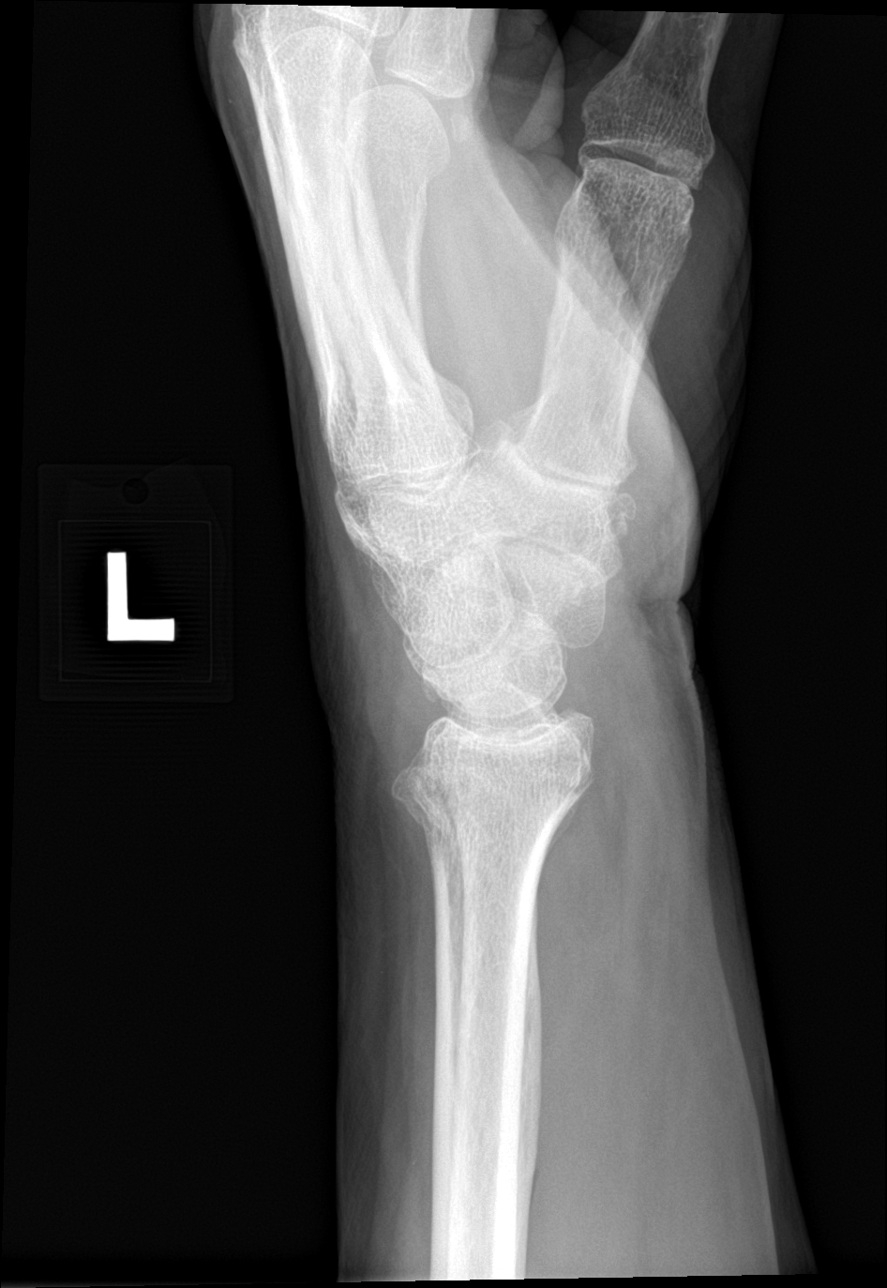

[wrist ap (2 of 2)]
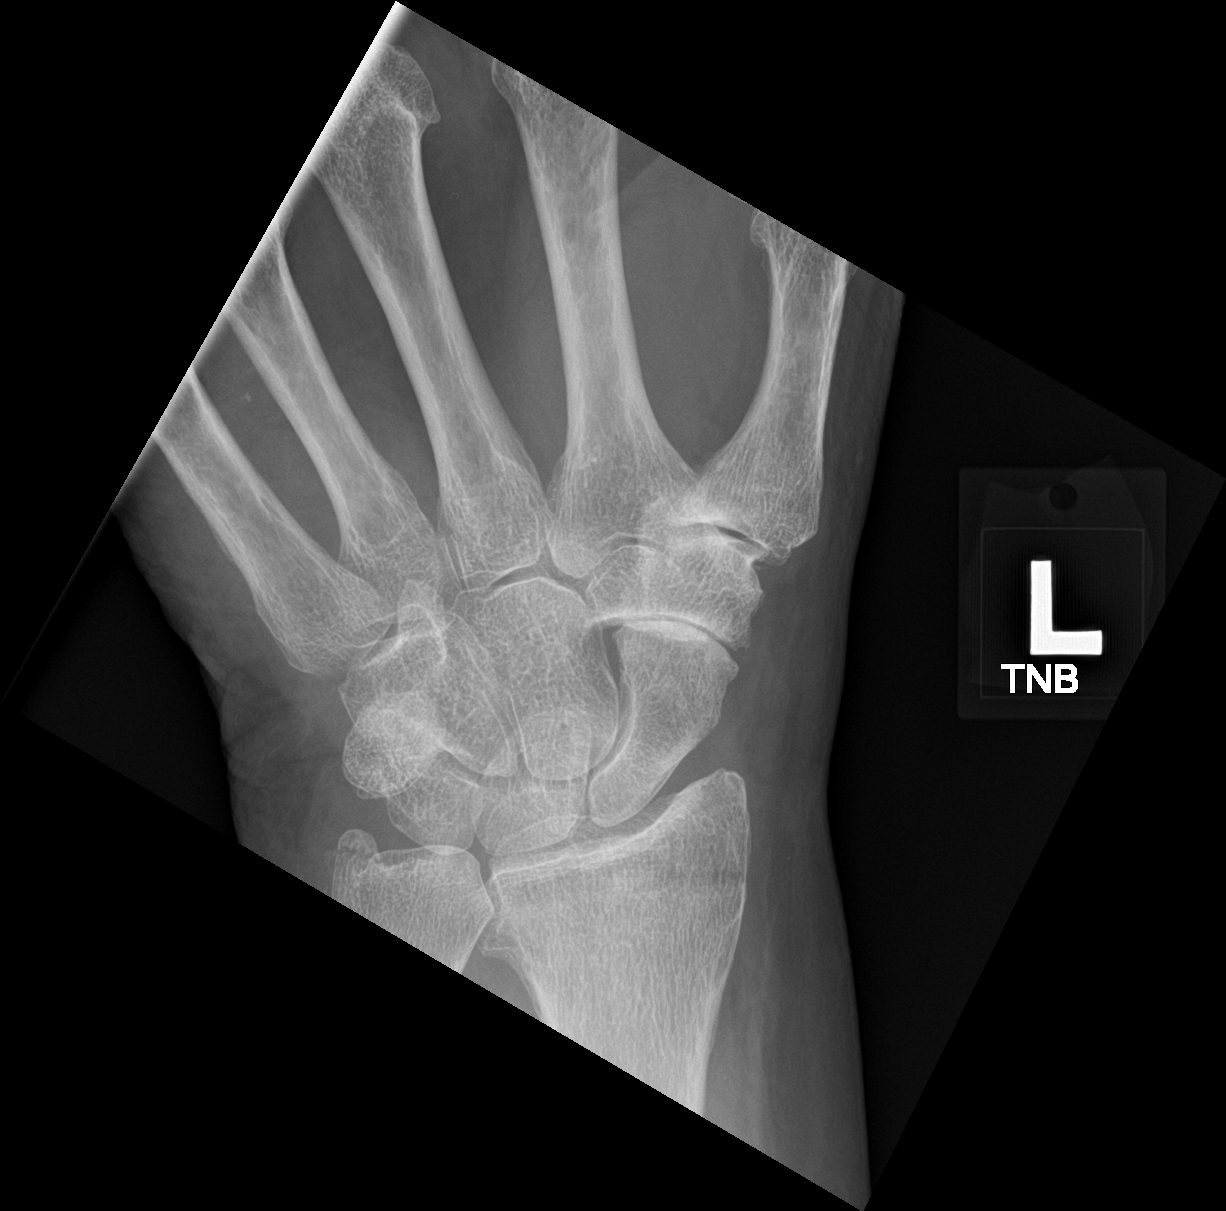

[4 of 4 positions shown; findings below may reference images not displayed]

FINDINGS: A nondisplaced transverse fracture of the distal radius is noted
with equivocal intra-articular extension.

An ulnar styloid fracture is present.

No dislocation.

Degenerative changes along the LATERAL wrist noted.
IMPRESSION: Nondisplaced distal radial fracture with equivocal intra-articular
extension.

Ulnar styloid fracture.

## 2021-11-16 ENCOUNTER — Other Ambulatory Visit: Payer: Self-pay | Admitting: Internal Medicine

## 2021-11-16 ENCOUNTER — Ambulatory Visit
Admission: RE | Admit: 2021-11-16 | Discharge: 2021-11-16 | Disposition: A | Discharge status: Home / Self Care | Payer: Medicare HMO | Source: Ambulatory Visit | Attending: Internal Medicine | Admitting: Internal Medicine

## 2021-11-16 DIAGNOSIS — I2699 Other pulmonary embolism without acute cor pulmonale: Secondary | ICD-10-CM

## 2021-11-16 MED ORDER — IOHEXOL 350 MG/ML SOLN
75.0000 mL | Freq: Once | INTRAVENOUS | Status: AC | PRN
  Administered 2021-11-16: 75 mL via INTRAVENOUS

## 2023-02-06 DEATH — deceased
